# Patient Record
Sex: Female | Born: 1975 | Race: White | Hispanic: No | Marital: Married | Smoking: Never smoker
Health system: Southern US, Community
[De-identification: ages and names within clinical notes are randomized; demographics above are authoritative.]

## PROBLEM LIST (undated history)

## (undated) DIAGNOSIS — Z87442 Personal history of urinary calculi: Secondary | ICD-10-CM

## (undated) HISTORY — DX: Personal history of urinary calculi: Z87.442

## (undated) HISTORY — PX: NASAL SINUS SURGERY: SHX719

## (undated) HISTORY — PX: TOE SURGERY: SHX1073

## (undated) HISTORY — PX: LITHOTRIPSY: SUR834

## (undated) HISTORY — PX: BREAST BIOPSY: SHX20

---

## 2018-11-30 DIAGNOSIS — Z0000 Encounter for general adult medical examination without abnormal findings: Secondary | ICD-10-CM | POA: Diagnosis not present

## 2018-11-30 DIAGNOSIS — Z6826 Body mass index (BMI) 26.0-26.9, adult: Secondary | ICD-10-CM | POA: Diagnosis not present

## 2018-11-30 DIAGNOSIS — Z1239 Encounter for other screening for malignant neoplasm of breast: Secondary | ICD-10-CM | POA: Diagnosis not present

## 2018-11-30 DIAGNOSIS — Z1331 Encounter for screening for depression: Secondary | ICD-10-CM | POA: Diagnosis not present

## 2018-12-03 DIAGNOSIS — Z0000 Encounter for general adult medical examination without abnormal findings: Secondary | ICD-10-CM | POA: Diagnosis not present

## 2018-12-03 DIAGNOSIS — M2550 Pain in unspecified joint: Secondary | ICD-10-CM | POA: Diagnosis not present

## 2018-12-20 DIAGNOSIS — Z719 Counseling, unspecified: Secondary | ICD-10-CM | POA: Diagnosis not present

## 2018-12-20 DIAGNOSIS — Z1211 Encounter for screening for malignant neoplasm of colon: Secondary | ICD-10-CM | POA: Diagnosis not present

## 2018-12-20 DIAGNOSIS — Z8371 Family history of colonic polyps: Secondary | ICD-10-CM | POA: Diagnosis not present

## 2018-12-20 DIAGNOSIS — R197 Diarrhea, unspecified: Secondary | ICD-10-CM | POA: Diagnosis not present

## 2019-10-13 DIAGNOSIS — Z01419 Encounter for gynecological examination (general) (routine) without abnormal findings: Secondary | ICD-10-CM | POA: Diagnosis not present

## 2019-10-13 DIAGNOSIS — Z6827 Body mass index (BMI) 27.0-27.9, adult: Secondary | ICD-10-CM | POA: Diagnosis not present

## 2019-10-13 DIAGNOSIS — N959 Unspecified menopausal and perimenopausal disorder: Secondary | ICD-10-CM | POA: Diagnosis not present

## 2019-10-25 DIAGNOSIS — Z1231 Encounter for screening mammogram for malignant neoplasm of breast: Secondary | ICD-10-CM | POA: Diagnosis not present

## 2019-11-03 ENCOUNTER — Other Ambulatory Visit: Payer: Self-pay | Admitting: Obstetrics & Gynecology

## 2019-11-03 DIAGNOSIS — R928 Other abnormal and inconclusive findings on diagnostic imaging of breast: Secondary | ICD-10-CM

## 2019-11-11 ENCOUNTER — Ambulatory Visit
Admission: RE | Admit: 2019-11-11 | Discharge: 2019-11-11 | Disposition: A | Discharge status: Home / Self Care | Payer: BC Managed Care – PPO | Source: Ambulatory Visit | Attending: Obstetrics & Gynecology | Admitting: Obstetrics & Gynecology

## 2019-11-11 ENCOUNTER — Other Ambulatory Visit: Payer: Self-pay

## 2019-11-11 ENCOUNTER — Other Ambulatory Visit: Payer: Self-pay | Admitting: Obstetrics & Gynecology

## 2019-11-11 DIAGNOSIS — N6321 Unspecified lump in the left breast, upper outer quadrant: Secondary | ICD-10-CM | POA: Diagnosis not present

## 2019-11-11 DIAGNOSIS — R928 Other abnormal and inconclusive findings on diagnostic imaging of breast: Secondary | ICD-10-CM

## 2019-11-11 DIAGNOSIS — R922 Inconclusive mammogram: Secondary | ICD-10-CM | POA: Diagnosis not present

## 2019-11-11 DIAGNOSIS — N6320 Unspecified lump in the left breast, unspecified quadrant: Secondary | ICD-10-CM

## 2020-05-14 ENCOUNTER — Other Ambulatory Visit: Payer: BC Managed Care – PPO

## 2020-05-31 ENCOUNTER — Other Ambulatory Visit: Payer: BC Managed Care – PPO

## 2020-07-31 DIAGNOSIS — R030 Elevated blood-pressure reading, without diagnosis of hypertension: Secondary | ICD-10-CM | POA: Diagnosis not present

## 2020-07-31 DIAGNOSIS — Z1331 Encounter for screening for depression: Secondary | ICD-10-CM | POA: Diagnosis not present

## 2020-07-31 DIAGNOSIS — Z1389 Encounter for screening for other disorder: Secondary | ICD-10-CM | POA: Diagnosis not present

## 2020-07-31 DIAGNOSIS — Z0000 Encounter for general adult medical examination without abnormal findings: Secondary | ICD-10-CM | POA: Diagnosis not present

## 2020-12-11 ENCOUNTER — Encounter: Payer: Self-pay | Admitting: Gastroenterology

## 2021-01-24 ENCOUNTER — Other Ambulatory Visit: Payer: Self-pay

## 2021-01-24 ENCOUNTER — Ambulatory Visit (AMBULATORY_SURGERY_CENTER): Payer: BC Managed Care – PPO | Admitting: *Deleted

## 2021-01-24 ENCOUNTER — Other Ambulatory Visit: Payer: Self-pay | Admitting: Gastroenterology

## 2021-01-24 VITALS — Ht 63.0 in | Wt 150.0 lb

## 2021-01-24 DIAGNOSIS — Z1211 Encounter for screening for malignant neoplasm of colon: Secondary | ICD-10-CM

## 2021-01-24 MED ORDER — PEG-KCL-NACL-NASULF-NA ASC-C 100 G PO SOLR: 1.0000 | Freq: Once | ORAL | 0 refills | Status: AC

## 2021-01-24 NOTE — Progress Notes (Signed)
Patient's pre-visit was done today over the phone with the patient due to COVID-19 pandemic. Name,DOB and address verified. Insurance verified. Patient denies any allergies to Eggs and Soy. Patient denies any problems with anesthesia/sedation. Patient denies taking diet pills or blood thinners. No home Oxygen. Packet of Prep instructions mailed to patient including a copy of a consent form-pt is aware. Patient understands to call us back with any questions or concerns. Patient is aware of our care-partner policy and 0000000 safety protocol.   EMMI education assigned to the patient for the procedure, sent to [place].   The patient is COVID-19 vaccinated.

## 2021-01-25 ENCOUNTER — Encounter: Payer: Self-pay | Admitting: Gastroenterology

## 2021-01-28 ENCOUNTER — Telehealth: Payer: Self-pay | Admitting: Gastroenterology

## 2021-01-28 NOTE — Telephone Encounter (Signed)
Pt states her Pharmacy doesn't have her Movi but she has been reading and wants to do the Miralax Gatorade prep-   I sent her New Miralax prep instructions via My Chart -  We discussed Miralax prep over the phone today

## 2021-01-30 ENCOUNTER — Encounter: Payer: Self-pay | Admitting: Internal Medicine

## 2021-02-05 ENCOUNTER — Encounter: Payer: BC Managed Care – PPO | Admitting: Gastroenterology

## 2021-03-05 DIAGNOSIS — N390 Urinary tract infection, site not specified: Secondary | ICD-10-CM | POA: Diagnosis not present

## 2021-03-28 DIAGNOSIS — R8290 Unspecified abnormal findings in urine: Secondary | ICD-10-CM | POA: Diagnosis not present

## 2021-05-23 DIAGNOSIS — S335XXA Sprain of ligaments of lumbar spine, initial encounter: Secondary | ICD-10-CM | POA: Diagnosis not present

## 2021-05-23 DIAGNOSIS — M25551 Pain in right hip: Secondary | ICD-10-CM | POA: Diagnosis not present

## 2021-06-26 DIAGNOSIS — Z01419 Encounter for gynecological examination (general) (routine) without abnormal findings: Secondary | ICD-10-CM | POA: Diagnosis not present

## 2021-06-26 DIAGNOSIS — Z6827 Body mass index (BMI) 27.0-27.9, adult: Secondary | ICD-10-CM | POA: Diagnosis not present

## 2021-06-26 DIAGNOSIS — N959 Unspecified menopausal and perimenopausal disorder: Secondary | ICD-10-CM | POA: Diagnosis not present

## 2021-07-10 ENCOUNTER — Other Ambulatory Visit: Payer: Self-pay | Admitting: Obstetrics and Gynecology

## 2021-07-10 DIAGNOSIS — N6320 Unspecified lump in the left breast, unspecified quadrant: Secondary | ICD-10-CM

## 2021-07-11 DIAGNOSIS — Z3202 Encounter for pregnancy test, result negative: Secondary | ICD-10-CM | POA: Diagnosis not present

## 2021-07-11 DIAGNOSIS — Z30433 Encounter for removal and reinsertion of intrauterine contraceptive device: Secondary | ICD-10-CM | POA: Diagnosis not present

## 2021-08-02 ENCOUNTER — Ambulatory Visit
Admission: RE | Admit: 2021-08-02 | Discharge: 2021-08-02 | Disposition: A | Discharge status: Home / Self Care | Payer: BC Managed Care – PPO | Source: Ambulatory Visit | Attending: Obstetrics and Gynecology | Admitting: Obstetrics and Gynecology

## 2021-08-02 DIAGNOSIS — N6320 Unspecified lump in the left breast, unspecified quadrant: Secondary | ICD-10-CM

## 2021-08-02 DIAGNOSIS — R922 Inconclusive mammogram: Secondary | ICD-10-CM | POA: Diagnosis not present

## 2021-09-05 DIAGNOSIS — Z30431 Encounter for routine checking of intrauterine contraceptive device: Secondary | ICD-10-CM | POA: Diagnosis not present

## 2021-11-12 ENCOUNTER — Encounter: Payer: Self-pay | Admitting: Gastroenterology

## 2021-11-12 ENCOUNTER — Other Ambulatory Visit: Payer: BC Managed Care – PPO

## 2021-11-12 ENCOUNTER — Ambulatory Visit (INDEPENDENT_AMBULATORY_CARE_PROVIDER_SITE_OTHER): Payer: BC Managed Care – PPO | Admitting: Gastroenterology

## 2021-11-12 VITALS — BP 126/88 | HR 76 | Ht 63.0 in | Wt 155.0 lb

## 2021-11-12 DIAGNOSIS — K625 Hemorrhage of anus and rectum: Secondary | ICD-10-CM | POA: Diagnosis not present

## 2021-11-12 DIAGNOSIS — Z8379 Family history of other diseases of the digestive system: Secondary | ICD-10-CM

## 2021-11-12 DIAGNOSIS — K649 Unspecified hemorrhoids: Secondary | ICD-10-CM

## 2021-11-12 DIAGNOSIS — Z1211 Encounter for screening for malignant neoplasm of colon: Secondary | ICD-10-CM | POA: Diagnosis not present

## 2021-11-12 MED ORDER — NA SULFATE-K SULFATE-MG SULF 17.5-3.13-1.6 GM/177ML PO SOLN: 1.0000 | ORAL | 0 refills | Status: DC

## 2021-11-12 NOTE — Progress Notes (Signed)
HPI :  46 year old female with a history of renal stones, C-section, hemorrhoids, here for new patient visit for symptomatic hemorrhoids.  She states she has had hemorrhoids since her first pregnancy about 16 years ago.  Normally these do not bother her at baseline.  She has 2 children, 1 of which plays travel softball and she travels most weekends for tournaments.  She states when she is not at home she typically does not have a bowel movement and becomes constipated.  Over a week ago she states she felt hemorrhoid get thrombosed, was quite painful for a few days, last Thursday she states it burst and had expression of clot and some blood.  She states it felt much better for a day or so, but then has had some superficial bleeding since then and feels that the clot has not completely resolved.  She states she is definitely better than she was last week and it is getting better with time but has not resolved.  She is concerned she is about to travel to [place] and will be there for the rest of the week.  She states when she is at home and not traveling she moves her bowels without difficulty.  She has tried some Preparation H on the hemorrhoid but has not really helped too much yet.  She is having some superficial bleeding when she moves her bowels, states it seems to be a scant amount.  She normally has no blood in her stools at baseline.  No abdominal pains.  She does not take anything for her bowels normally.  She had 2 C-sections but no other major abdominal surgeries.  No family history of colon cancer, her grandmother had ureteral cancer.  Mother had colon polyps in her age 85s but no polyps at a younger age.  Her mother interestingly was recently diagnosed with celiac disease.  The patient has some dairy intolerance and some bloating and gas at baseline.  She has never been tested for celiac disease before.  She has otherwise never had a colonoscopy or colon cancer screening  otherwise.    Past Medical History:  Diagnosis Date   History of kidney stones      Past Surgical History:  Procedure Laterality Date   BREAST BIOPSY Left    CESAREAN SECTION     x2   LITHOTRIPSY     NASAL SINUS SURGERY     TOE SURGERY     Family History  Problem Relation Age of Onset   Colon polyps Mother    Colon cancer Paternal Grandmother    Esophageal cancer Neg Hx    Rectal cancer Neg Hx    Stomach cancer Neg Hx    Social History   Tobacco Use   Smoking status: Never   Smokeless tobacco: Never  Vaping Use   Vaping Use: Never used  Substance Use Topics   Alcohol use: Yes    Alcohol/week: 1.0 standard drink of alcohol    Types: 1 Glasses of wine per week   Drug use: Not Currently   Current Outpatient Medications  Medication Sig Dispense Refill   levonorgestrel (MIRENA, 52 MG,) 20 MCG/DAY IUD by Intrauterine route as directed.     No current facility-administered medications for this visit.   No Known Allergies   Review of Systems: All systems reviewed and negative except where noted in HPI.    Physical Exam: BP 126/88   Pulse 76   Ht 5\' 3"  (1.6 m)  Wt 155 lb (70.3 kg)   BMI 27.46 kg/m  Constitutional: Pleasant,well-developed, female in no acute distress. HEENT: Normocephalic and atraumatic. Conjunctivae are normal. No scleral icterus. Neck supple.  Cardiovascular: Normal rate, regular rhythm.  Pulmonary/chest: Effort normal and breath sounds normal. No wheezing, rales or rhonchi. Abdominal: Soft, nondistended, nontender.  There are no masses palpable. No hepatomegaly. DRE - Sheliah Plane CMA - standby - small thrombosed hemorrhoid healing with punctum noted on R lateral side, small skin tags. No mass lesions or polypoid lesions. Full anoscopy deferred due to discomfort Extremities: no edema Lymphadenopathy: No cervical adenopathy noted. Neurological: Alert and oriented to person place and time. Skin: Skin is warm and dry. No rashes  noted. Psychiatric: Normal mood and affect. Behavior is normal.   ASSESSMENT AND PLAN: 46 year old female here for new patient assessment the following:  Hemorrhoids - healing thrombosed hemorrhoid Rectal bleeding secondary to hemorrhoids Family history of celiac disease /bloating Colon cancer screening  As above, the patient has had longstanding hemorrhoids since pregnancy 16 years ago although no routine or frequent symptoms from them.  Last week had symptoms very consistent with a thrombosed hemorrhoid, is improved from last week but having some mild rectal bleeding and discomfort.  Exam as above, she has a healing thrombosed hemorrhoid.  Counseled her I think this will get better with time and conservative measures at this point given time course.  Recommend sitz bath's twice daily, and then also would use 1% hydrocortisone cream, pea-sized amount applied PR twice daily for the next 1 to 2 weeks (discussed using Anusol but due to cost we will try hydrocortisone cream first.)  She also needs to keep moving her bowels regularly and avoid constipation, especially while traveling.  In this setting would recommend using MiraLAX every day and titrate that up or down to keep stool soft while traveling.  She needs a colonoscopy for screening purposes, we will give her some time to allow her to heal from this occurrence.  We will tentatively plan on this in the next 2 months or so.  If failure to heal or worsening in the interim she should contact me.  We discussed risk benefits of colonoscopy and anesthesia with her, she wants to proceed  Otherwise her mother was recently diagnosed with celiac disease and the patient has some dairy intolerance and baseline bloating.  We will screen her with labs for celiac disease today to make sure negative.  She agreed  Plan: - start Sitz baths twice daily - start 1% hydrocortisone cream - pea sized amount applied PR BID for 1-2 weeks - start MIralax daily to keep  stools soft while traveling - go to the lab for TTG IgA and total IgA level - schedule colonoscopy in August - call sooner for persistent or worsening symptoms despite medical therapy  Harlin Rain, MD [facility]  CC: Marcelle Overlie, MD

## 2021-11-13 LAB — IGA: Immunoglobulin A: 241 mg/dL (ref 47–310)

## 2021-11-13 LAB — TISSUE TRANSGLUTAMINASE, IGA: (tTG) Ab, IgA: <1 U/mL

## 2021-12-26 ENCOUNTER — Encounter: Payer: Self-pay | Admitting: Gastroenterology

## 2022-01-02 ENCOUNTER — Ambulatory Visit (AMBULATORY_SURGERY_CENTER): Payer: BC Managed Care – PPO | Admitting: Gastroenterology

## 2022-01-02 ENCOUNTER — Encounter: Payer: Self-pay | Admitting: Gastroenterology

## 2022-01-02 VITALS — BP 129/83 | HR 60 | Temp 98.6°F | Resp 14

## 2022-01-02 DIAGNOSIS — K625 Hemorrhage of anus and rectum: Secondary | ICD-10-CM

## 2022-01-02 DIAGNOSIS — K649 Unspecified hemorrhoids: Secondary | ICD-10-CM

## 2022-01-02 DIAGNOSIS — D122 Benign neoplasm of ascending colon: Secondary | ICD-10-CM

## 2022-01-02 DIAGNOSIS — Z1211 Encounter for screening for malignant neoplasm of colon: Secondary | ICD-10-CM

## 2022-01-02 DIAGNOSIS — K635 Polyp of colon: Secondary | ICD-10-CM | POA: Diagnosis not present

## 2022-01-02 DIAGNOSIS — D125 Benign neoplasm of sigmoid colon: Secondary | ICD-10-CM

## 2022-01-02 DIAGNOSIS — K620 Anal polyp: Secondary | ICD-10-CM

## 2022-01-02 MED ORDER — SODIUM CHLORIDE 0.9 % IV SOLN: 500.0000 mL | Freq: Once | INTRAVENOUS | Status: AC

## 2022-01-02 NOTE — Progress Notes (Signed)
[facility] History and Physical   Primary Care Physician:  Pcp, No   Reason for Procedure:   Screening, history of rectal bleeding suspected due to hemorrhoids  Plan:    colonoscopy     HPI: Carol Medina is a 46 y.o. female  here for colonoscopy  - first time exam, history of rectal bleeding thought to be due to hemorrhoids. Recently had a thrombosed hemorrhoid. Was placed on Miralax for constipation.  No family history of colon cancer known. Otherwise feels well without any cardiopulmonary symptoms.   I have discussed risks / benefits of anesthesia and endoscopic procedure with Charlene Brooke and they wish to proceed with the exams as outlined today.    Past Medical History:  Diagnosis Date   History of kidney stones     Past Surgical History:  Procedure Laterality Date   BREAST BIOPSY Left    CESAREAN SECTION     x2   LITHOTRIPSY     NASAL SINUS SURGERY     TOE SURGERY      Prior to Admission medications   Medication Sig Start Date End Date Taking? Authorizing Provider  levonorgestrel (MIRENA, 52 MG,) 20 MCG/DAY IUD by Intrauterine route as directed. 01/19/16   [provider]  progesterone (PROMETRIUM) 100 MG capsule Take 1 tablet by mouth daily.    [provider]  progesterone (PROMETRIUM) 100 MG capsule Take 100 mg by mouth daily. 07/27/21   [provider]    Current Outpatient Medications  Medication Sig Dispense Refill   levonorgestrel (MIRENA, 52 MG,) 20 MCG/DAY IUD by Intrauterine route as directed.     progesterone (PROMETRIUM) 100 MG capsule Take 1 tablet by mouth daily.     progesterone (PROMETRIUM) 100 MG capsule Take 100 mg by mouth daily.     Current Facility-Administered Medications  Medication Dose Route Frequency Provider Last Rate Last Admin   0.9 %  sodium chloride infusion  500 mL Intravenous Once Stevon Gough, Carlota Raspberry, MD        Allergies as of 01/02/2022   (No Known Allergies)    Family History   Problem Relation Age of Onset   Celiac disease Mother    Colon polyps Mother    Prostate cancer Father    Colon cancer Paternal Grandmother    Esophageal cancer Neg Hx    Rectal cancer Neg Hx    Stomach cancer Neg Hx     Social History   Socioeconomic History   Marital status: Married    Spouse name: Not on file   Number of children: 2   Years of education: Not on file   Highest education level: Not on file  Occupational History   Not on file  Tobacco Use   Smoking status: Never   Smokeless tobacco: Never  Vaping Use   Vaping Use: Never used  Substance and Sexual Activity   Alcohol use: Yes    Alcohol/week: 1.0 standard drink of alcohol    Types: 1 Glasses of wine per week   Drug use: Not Currently   Sexual activity: Not on file  Other Topics Concern   Not on file  Social History Narrative   Not on file   Social Determinants of Health   Financial Resource Strain: Not on file  Food Insecurity: Not on file  Transportation Needs: Not on file  Physical Activity: Not on file  Stress: Not on file  Social Connections: Not on file  Intimate Partner Violence: Not on file  Review of Systems: All other review of systems negative except as mentioned in the HPI.  Physical Exam: Vital signs Temp 98.6 F (37 C)   General:   Alert,  Well-developed, pleasant and cooperative in NAD Lungs:  Clear throughout to auscultation.   Heart:  Regular rate and rhythm Abdomen:  Soft, nontender and nondistended.   Neuro/Psych:  Alert and cooperative. Normal mood and affect. A and O x 3  Jolly Mango, MD [facility]

## 2022-01-02 NOTE — Op Note (Addendum)
[place] Patient Name: Carol Medina Procedure Date: 01/02/2022 10:52 AM MRN: [id number] Endoscopist: Remo Lipps P. Havery Moros , MD Age: 46 Referring MD:  Date of Birth: [birth year] Gender: Female Account #: [id number] Procedure:                Colonoscopy Indications:              This is the patient's first colonoscopy, Rectal                            bleeding - history of hemorrhoids Medicines:                Monitored Anesthesia Care Procedure:                Pre-Anesthesia Assessment:                           - Prior to the procedure, a History and Physical                            was performed, and patient medications and                            allergies were reviewed. The patient's tolerance of                            previous anesthesia was also reviewed. The risks                            and benefits of the procedure and the sedation                            options and risks were discussed with the patient.                            All questions were answered, and informed consent                            was obtained. Prior Anticoagulants: The patient has                            taken no previous anticoagulant or antiplatelet                            agents. ASA Grade Assessment: II - A patient with                            mild systemic disease. After reviewing the risks                            and benefits, the patient was deemed in                            satisfactory condition to undergo the procedure.  After obtaining informed consent, the colonoscope                            was passed under direct vision. Throughout the                            procedure, the patient's blood pressure, pulse, and                            oxygen saturations were monitored continuously. The                            Olympus PCF-H190DL (#[id number]) Colonoscope was                            introduced through the  anus and advanced to the the                            cecum, identified by appendiceal orifice and                            ileocecal valve. The colonoscopy was performed                            without difficulty. The patient tolerated the                            procedure well. The quality of the bowel                            preparation was good. The ileocecal valve,                            appendiceal orifice, and rectum were photographed. Scope In: 11:07:44 AM Scope Out: 12:10:33 PM Scope Withdrawal Time: 0 hours 58 minutes 52 seconds  Total Procedure Duration: 1 hour 2 minutes 49 seconds  Findings:                 Small skin tags were found on perianal exam.                           The terminal ileum appeared normal.                           A roughly 35 mm polyp was found in the ascending                            colon. The polyp was flat. The polyp was removed                            with a piecemeal technique using a cold snare.                            Resection and retrieval were complete.  A roughly 40 to 45 mm polyp was found in the                            ascending colon. The polyp was flat and overlying                            multiple folds. The polyp was removed with a                            piecemeal technique using a cold snare. The polyp                            was much larger than initially appreciated when                            resection began as the borders were quite subtle                            and not easily seen. The ascending colon is quite                            angulated, positioning was difficult. Very                            challenging polypectomy due to positioning.                            Resection and retrieval were thought to have been                            complete with captivator snare. Area distal to the                            lesion and across from the  lumen of the site was                            tattooed with an injection of Spot (carbon black).                           A 4 mm polyp was found in the sigmoid colon. The                            polyp was sessile. The polyp was removed with a                            cold snare. Resection and retrieval were complete.                           Anal papilla(e) were hypertrophied. Biopsies were                            taken with a cold forceps for  histology.                           Internal hemorrhoids were found during                            retroflexion. The hemorrhoids were small.                           The exam was otherwise without abnormality. Complications:            No immediate complications. Estimated blood loss:                            Minimal. Estimated Blood Loss:     Estimated blood loss was minimal. Impression:               - Perianal skin tags found on perianal exam.                           - The examined portion of the ileum was normal.                           - One 35 mm polyp in the ascending colon, removed                            piecemeal using a cold snare. Resected and                            retrieved.                           - One roughly 45 mm polyp in the ascending colon,                            removed piecemeal using a cold snare. Resected and                            retrieved. Tattooed.                           - One 4 mm polyp in the sigmoid colon, removed with                            a cold snare. Resected and retrieved.                           - Anal papilla(e) were hypertrophied. Biopsied.                           - Internal hemorrhoids.                           - The examination was otherwise normal.                           Bleeding  due to hemorrhoids which has since                            improved with treatment of constipation. Recommendation:           - Patient has a contact number available for                             emergencies. The signs and symptoms of potential                            delayed complications were discussed with the                            patient. Return to normal activities tomorrow.                            Written discharge instructions were provided to the                            patient.                           - Resume previous diet.                           - Continue present medications.                           - Increase Miralax to BID dosing if needed for                            constipation                           - Await pathology results.                           - Repeat colonoscopy in 3-6 months for surveillance. Remo Lipps P. Eilis Chestnutt, MD 01/02/2022 12:24:40 PM This report has been signed electronically.

## 2022-01-02 NOTE — Progress Notes (Signed)
Pt's states no medical or surgical changes since previsit or office visit. 

## 2022-01-02 NOTE — Patient Instructions (Signed)
Handout on polyps & hemorrhoids ncrease  given to you today  Await pathology results on polyps removed   Increase Miralax to twice a day if needed for constipation  Repeat Colonoscopy in 3- 6 months - you should get a reminder in the mail when this is due to set up     Carol Medina:   Refer to the procedure report that was given to you for any specific questions about what was found during the examination.  If the procedure report does not answer your questions, please call your gastroenterologist to clarify.  If you requested that your care partner not be given the details of your procedure findings, then the procedure report has been included in a sealed envelope for you to review at your convenience later.  YOU SHOULD EXPECT: Some feelings of bloating in the abdomen. Passage of more gas than usual.  Walking can help get rid of the air that was put into your GI tract during the procedure and reduce the bloating. If you had a lower endoscopy (such as a colonoscopy or flexible sigmoidoscopy) you may notice spotting of blood in your stool or on the toilet paper. If you underwent a bowel prep for your procedure, you may not have a normal bowel movement for a few days.  Please Note:  You might notice some irritation and congestion in your nose or some drainage.  This is from the oxygen used during your procedure.  There is no need for concern and it should clear up in a day or so.  SYMPTOMS TO REPORT IMMEDIATELY:  Following lower endoscopy (colonoscopy or flexible sigmoidoscopy):  Excessive amounts of blood in the stool  Significant tenderness or worsening of abdominal pains  Swelling of the abdomen that is new, acute  Fever of 100F or higher   For urgent or emergent issues, a gastroenterologist can be reached at any hour by calling [phone]. Do not use MyChart messaging for urgent concerns.    DIET:  We do recommend a  small meal at first, but then you may proceed to your regular diet.  Drink plenty of fluids but you should avoid alcoholic beverages for 24 hours.  ACTIVITY:  You should plan to take it easy for the rest of today and you should NOT DRIVE or use heavy machinery until tomorrow (because of the sedation medicines used during the test).    FOLLOW UP: Our staff will call the number listed on your records the next business day following your procedure.  We will call around 7:15- 8:00 am to check on you and address any questions or concerns that you may have regarding the information given to you following your procedure. If we do not reach you, we will leave a message.  If you develop any symptoms (ie: fever, flu-like symptoms, shortness of breath, cough etc.) before then, please call [phone].  If you test positive for Covid 19 in the 2 weeks post procedure, please call and report this information to [place].    If any biopsies were taken you will be contacted by phone or by letter within the next 1-3 weeks.  Please call us at [phone] if you have not heard about the biopsies in 3 weeks.    SIGNATURES/CONFIDENTIALITY: You and/or your care partner have signed paperwork which will be entered into your electronic medical record.  These signatures attest to the fact that that the information above on your  After Visit Summary has been reviewed and is understood.  Full responsibility of the confidentiality of this discharge information lies with you and/or your care-partner.

## 2022-01-02 NOTE — Progress Notes (Signed)
PT taken to PACU. Monitors in place. VSS. Report given to RN. 

## 2022-01-03 ENCOUNTER — Telehealth: Payer: Self-pay | Admitting: *Deleted

## 2022-01-03 NOTE — Telephone Encounter (Signed)
  Follow up Call-     01/02/2022   10:27 AM  Call back number  Post procedure Call Back phone  # [phone]  Permission to leave phone message Yes     Patient questions:  Do you have a fever, pain , or abdominal swelling? No. Pain Score  0 *  Have you tolerated food without any problems? Yes.    Have you been able to return to your normal activities? Yes.    Do you have any questions about your discharge instructions: Diet   No. Medications  No. Follow up visit  No.  Do you have questions or concerns about your Care? No.  Actions: * If pain score is 4 or above: No action needed, pain <4.

## 2022-01-08 ENCOUNTER — Telehealth: Payer: Self-pay | Admitting: Gastroenterology

## 2022-01-08 NOTE — Telephone Encounter (Signed)
Patient called about the results of her recent colonoscopy.  Please call patient and advise.  Thank you.

## 2022-01-08 NOTE — Telephone Encounter (Signed)
Patient reviewed MyChart message sent by Dr. Havery Moros. Seen by patient Carol Medina on 01/08/2022  1:51 PM

## 2022-01-08 NOTE — Telephone Encounter (Signed)
Carol Medina I just relayed the results of her colonoscopy via MyChart message.  Can you let her know that the polyps were precancerous and recommending a repeat colonoscopy in 6 months given the size of the polyps and how they were removed.  There is no evidence of any cancer within the polyps which is good news.  She should call back in 4 to 5 months to schedule her next exam.  Thanks. She can see the Mychart message I sent her for further details. Thanks

## 2022-03-26 ENCOUNTER — Encounter: Payer: Self-pay | Admitting: Gastroenterology

## 2022-04-19 IMAGING — MG DIGITAL DIAGNOSTIC BILAT W/ TOMO W/ CAD
8 series · 9 of 24 positions shown · non-contrast
Comparison: Previous exam(s).

CLINICAL DATA: Delayed followup for probably benign LEFT breast
asymmetry and minimally complicated cyst.

EXAM:
DIGITAL DIAGNOSTIC BILATERAL MAMMOGRAM WITH TOMOSYNTHESIS AND CAD;
ULTRASOUND LEFT BREAST LIMITED
TECHNIQUE: Bilateral digital diagnostic mammography and breast tomosynthesis
was performed. The images were evaluated with computer-aided
detection.; Targeted ultrasound examination of the left breast was
performed.

[L MLO synth-2D]
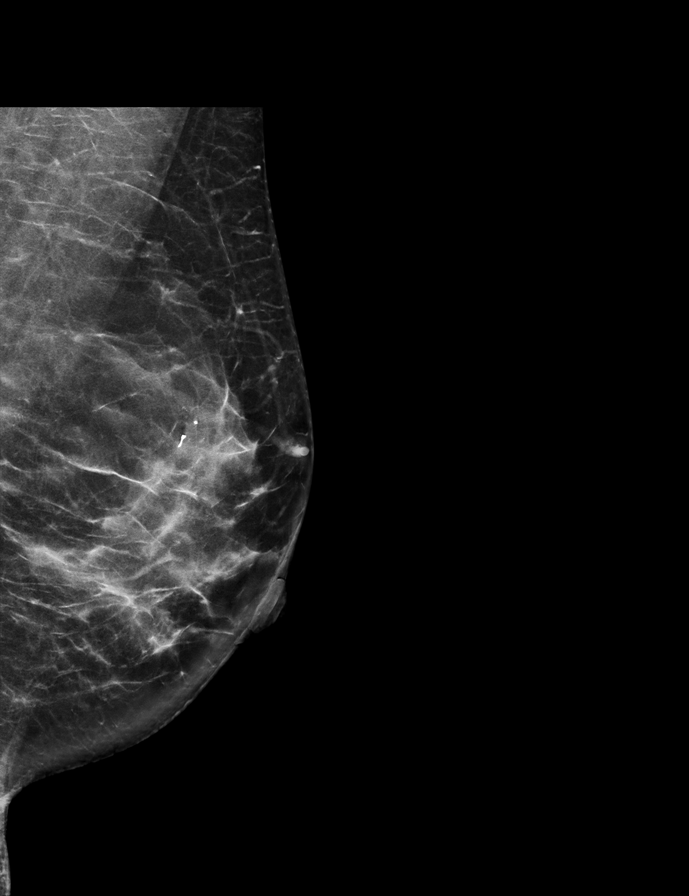

[R CC synth-2D]
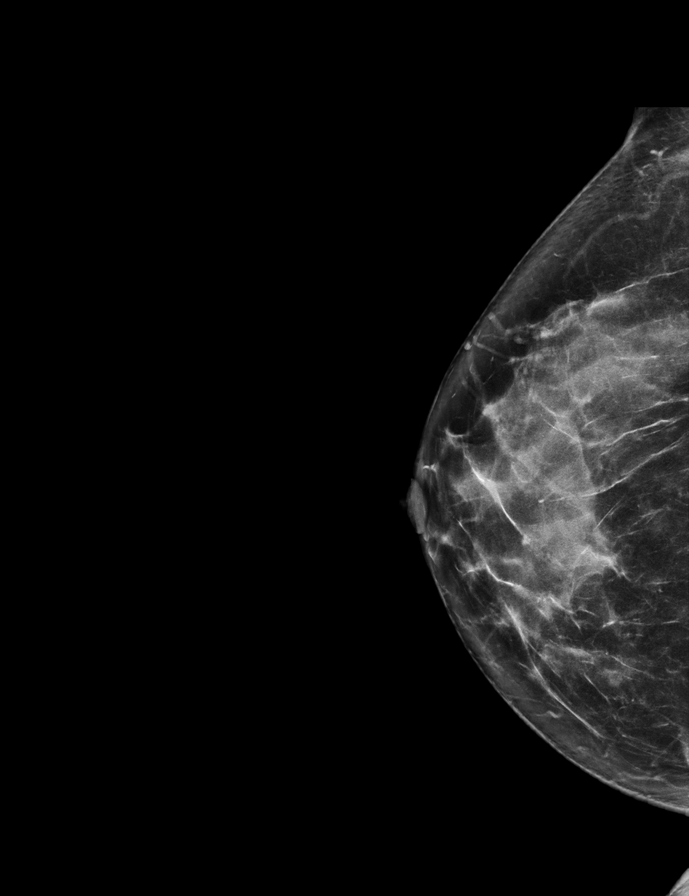

[L CC synth-2D]
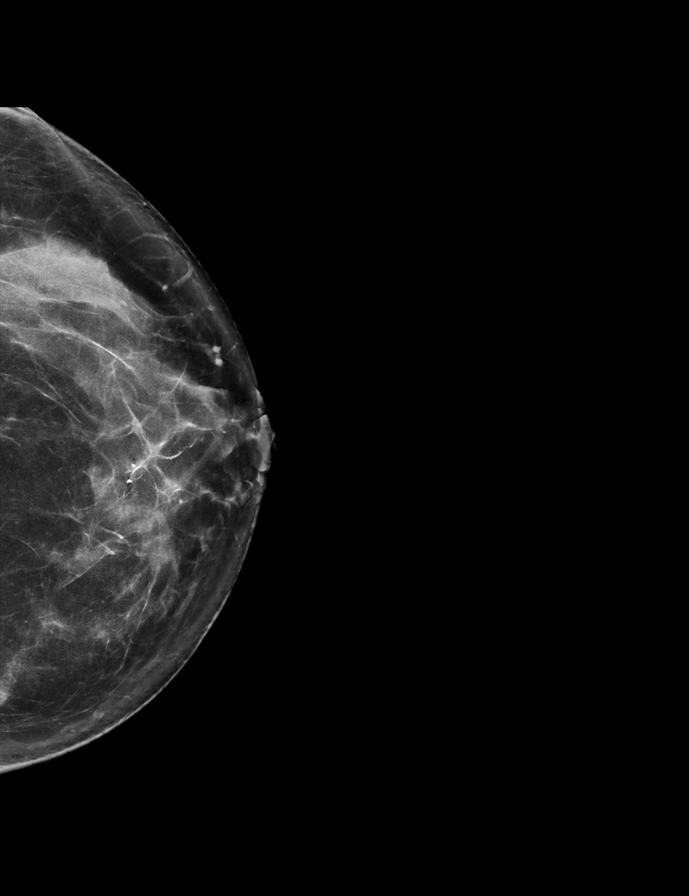

[R MLO synth-2D]
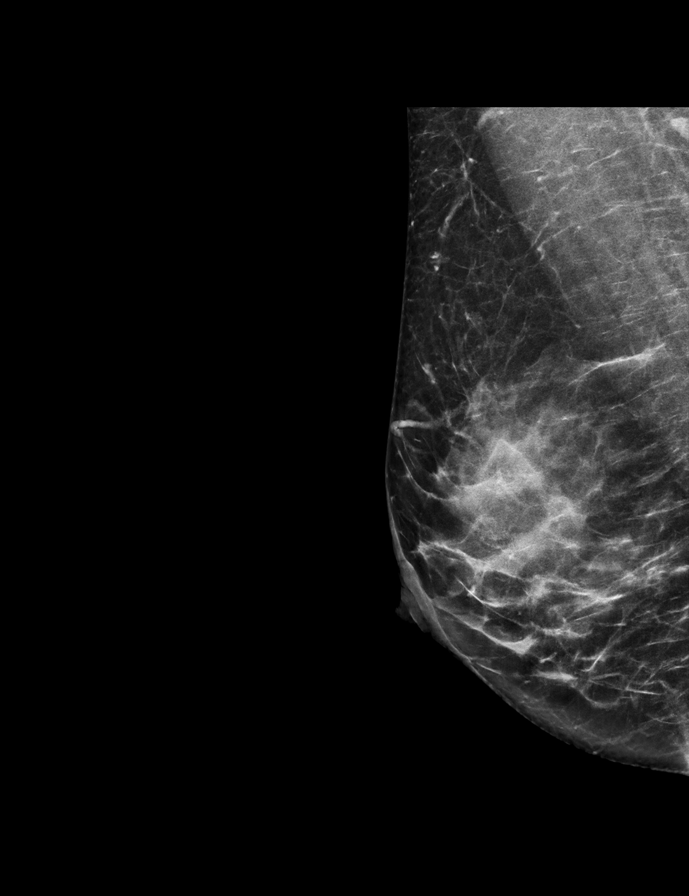

[L CC tomo · 2 of 72 frames shown]
[frame 24/72]
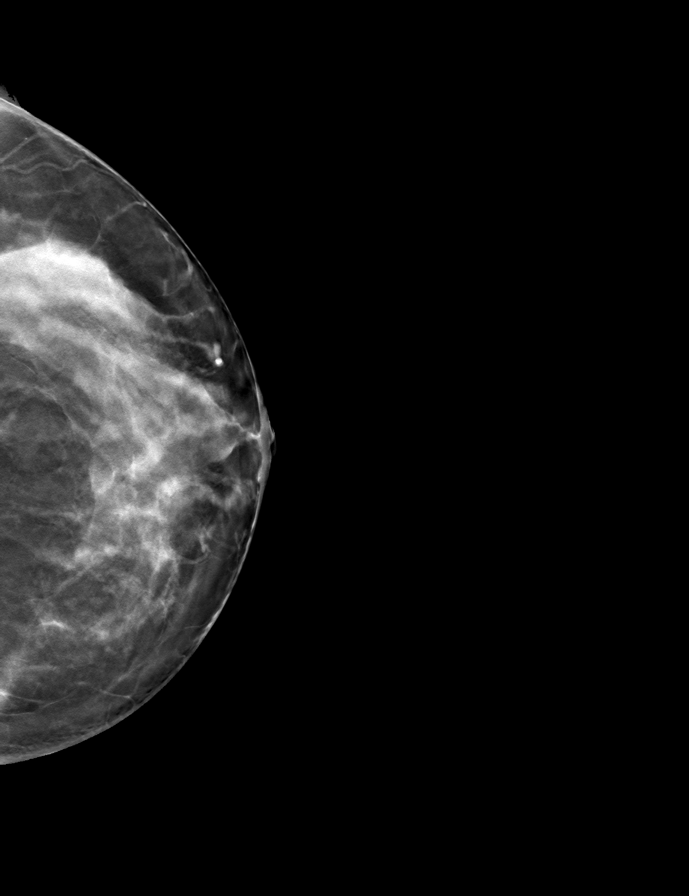
[frame 37/72]
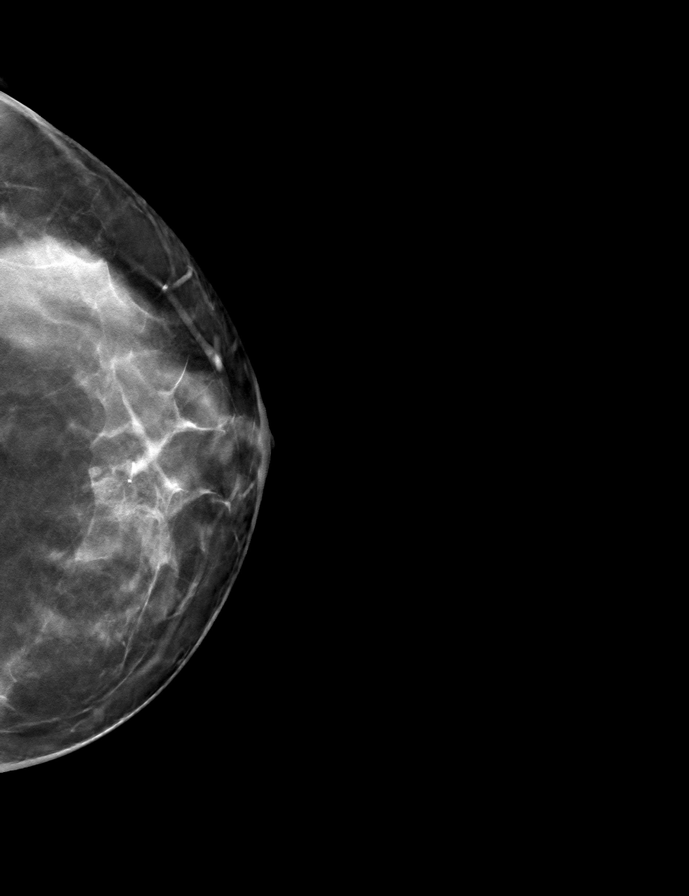

[R CC tomo · tomo slice 33/65.0]
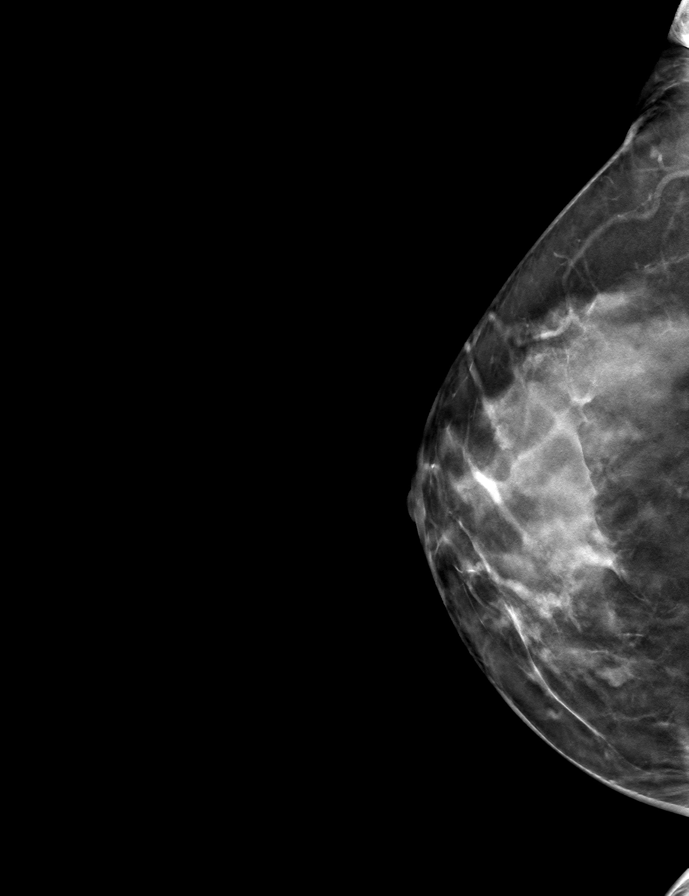

[R MLO tomo · tomo slice 32/63.0]
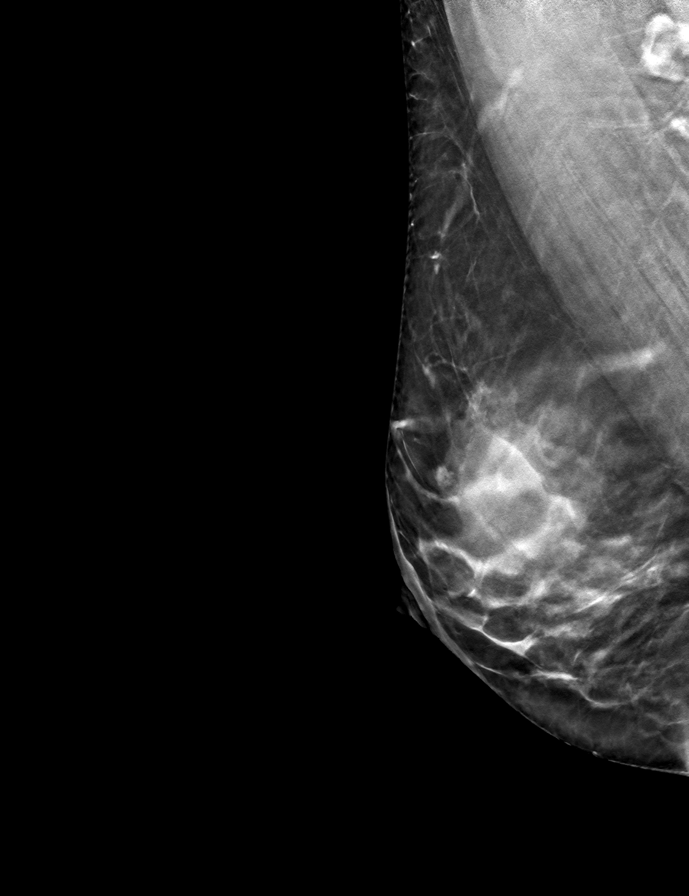

[L MLO tomo · tomo slice 35/68.0]
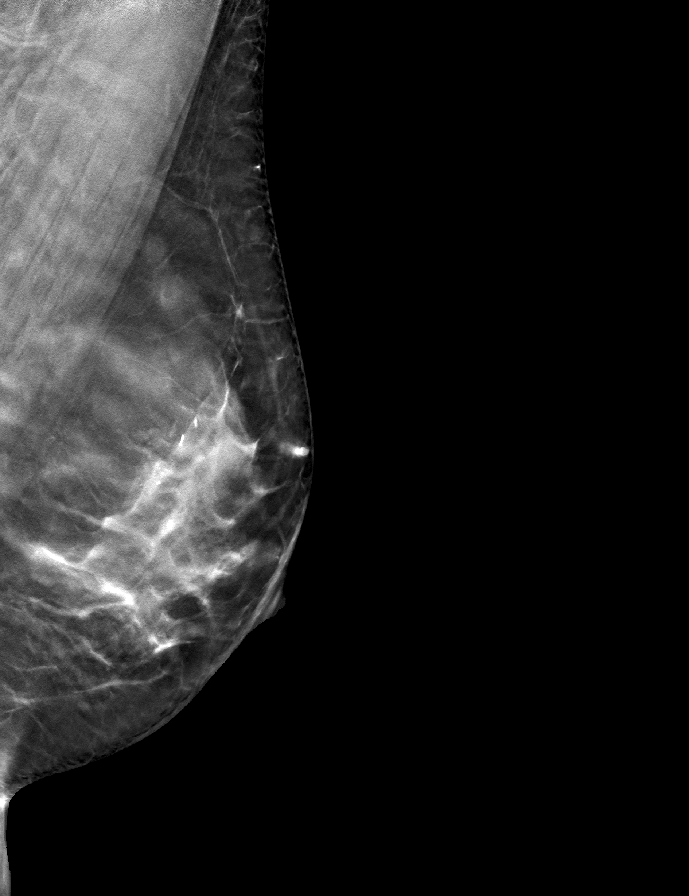

[9 of 24 positions shown; findings below may reference images not displayed]

ACR Breast Density Category c: The breast tissue is heterogeneously
dense, which may obscure small masses.
FINDINGS: No suspicious mass, distortion, or microcalcifications are
identified to suggest presence of malignancy.

Targeted ultrasound is performed, showing a simple cyst in the 1
o'clock location of the LEFT breast 5 centimeters from the nipple,
measuring 0.4 x 0 point by 0.2 centimeters. No suspicious mass,
distortion, or acoustic shadowing is demonstrated with ultrasound.
IMPRESSION: No mammographic or ultrasound evidence for malignancy.

RECOMMENDATION:
Screening mammogram in one year.(Code:68-J-ROR)

I have discussed the findings and recommendations with the patient.
If applicable, a reminder letter will be sent to the patient
regarding the next appointment.

BI-RADS CATEGORY  2: Benign.

## 2022-05-06 DIAGNOSIS — H35371 Puckering of macula, right eye: Secondary | ICD-10-CM | POA: Diagnosis not present

## 2022-05-06 DIAGNOSIS — H5202 Hypermetropia, left eye: Secondary | ICD-10-CM | POA: Diagnosis not present

## 2022-07-01 ENCOUNTER — Encounter: Payer: Self-pay | Admitting: Gastroenterology

## 2023-02-17 DIAGNOSIS — D485 Neoplasm of uncertain behavior of skin: Secondary | ICD-10-CM | POA: Diagnosis not present

## 2023-02-17 DIAGNOSIS — D225 Melanocytic nevi of trunk: Secondary | ICD-10-CM | POA: Diagnosis not present

## 2023-02-17 DIAGNOSIS — L820 Inflamed seborrheic keratosis: Secondary | ICD-10-CM | POA: Diagnosis not present

## 2023-02-17 DIAGNOSIS — L814 Other melanin hyperpigmentation: Secondary | ICD-10-CM | POA: Diagnosis not present

## 2024-02-26 ENCOUNTER — Encounter: Payer: Self-pay | Admitting: Gastroenterology

## 2024-03-08 ENCOUNTER — Other Ambulatory Visit (HOSPITAL_COMMUNITY): Payer: Self-pay | Admitting: Obstetrics and Gynecology

## 2024-03-08 DIAGNOSIS — Z1231 Encounter for screening mammogram for malignant neoplasm of breast: Secondary | ICD-10-CM

## 2024-03-10 ENCOUNTER — Ambulatory Visit (HOSPITAL_COMMUNITY)
Admission: RE | Admit: 2024-03-10 | Discharge: 2024-03-10 | Disposition: A | Discharge status: Home / Self Care | Source: Ambulatory Visit | Attending: Obstetrics and Gynecology | Admitting: Obstetrics and Gynecology

## 2024-03-10 DIAGNOSIS — Z1231 Encounter for screening mammogram for malignant neoplasm of breast: Secondary | ICD-10-CM | POA: Insufficient documentation

## 2024-04-05 ENCOUNTER — Encounter

## 2024-04-19 ENCOUNTER — Encounter: Admitting: Gastroenterology
